# Patient Record
Sex: Male | Born: 1975 | Race: White | Hispanic: No | Marital: Married | Smoking: Never smoker
Health system: Southern US, Community
[De-identification: ages and names within clinical notes are randomized; demographics above are authoritative.]

---

## 1997-12-28 ENCOUNTER — Emergency Department (HOSPITAL_COMMUNITY): Admission: EM | Admit: 1997-12-28 | Discharge: 1997-12-28 | Payer: Self-pay | Admitting: Emergency Medicine

## 2003-05-02 ENCOUNTER — Emergency Department (HOSPITAL_COMMUNITY): Admission: EM | Admit: 2003-05-02 | Discharge: 2003-05-02 | Payer: Self-pay | Admitting: Emergency Medicine

## 2016-07-04 ENCOUNTER — Encounter (HOSPITAL_BASED_OUTPATIENT_CLINIC_OR_DEPARTMENT_OTHER): Payer: Self-pay | Admitting: Emergency Medicine

## 2016-07-04 ENCOUNTER — Emergency Department (HOSPITAL_BASED_OUTPATIENT_CLINIC_OR_DEPARTMENT_OTHER): Payer: BLUE CROSS/BLUE SHIELD

## 2016-07-04 ENCOUNTER — Emergency Department (HOSPITAL_BASED_OUTPATIENT_CLINIC_OR_DEPARTMENT_OTHER)
Admission: EM | Admit: 2016-07-04 | Discharge: 2016-07-04 | Disposition: A | Discharge status: Home / Self Care | Payer: BLUE CROSS/BLUE SHIELD | Attending: Emergency Medicine | Admitting: Emergency Medicine

## 2016-07-04 DIAGNOSIS — K4090 Unilateral inguinal hernia, without obstruction or gangrene, not specified as recurrent: Secondary | ICD-10-CM | POA: Insufficient documentation

## 2016-07-04 DIAGNOSIS — R1031 Right lower quadrant pain: Secondary | ICD-10-CM | POA: Diagnosis present

## 2016-07-04 LAB — URINALYSIS, ROUTINE W REFLEX MICROSCOPIC
BILIRUBIN URINE: NEGATIVE
Glucose, UA: NEGATIVE mg/dL
HGB URINE DIPSTICK: NEGATIVE
KETONES UR: NEGATIVE mg/dL
Leukocytes, UA: NEGATIVE
Nitrite: NEGATIVE
Protein, ur: NEGATIVE mg/dL
SPECIFIC GRAVITY, URINE: 1.041 — AB (ref 1.005–1.030)
pH: 6 (ref 5.0–8.0)

## 2016-07-04 LAB — CBC WITH DIFFERENTIAL/PLATELET
Basophils Absolute: 0 10*3/uL (ref 0.0–0.1)
Basophils Relative: 0 %
Eosinophils Absolute: 0.5 10*3/uL (ref 0.0–0.7)
Eosinophils Relative: 4 %
HEMATOCRIT: 41.7 % (ref 39.0–52.0)
HEMOGLOBIN: 14.1 g/dL (ref 13.0–17.0)
LYMPHS ABS: 4.4 10*3/uL — AB (ref 0.7–4.0)
LYMPHS PCT: 35 %
MCH: 31 pg (ref 26.0–34.0)
MCHC: 33.8 g/dL (ref 30.0–36.0)
MCV: 91.6 fL (ref 78.0–100.0)
MONOS PCT: 7 %
Monocytes Absolute: 0.9 10*3/uL (ref 0.1–1.0)
NEUTROS ABS: 6.8 10*3/uL (ref 1.7–7.7)
NEUTROS PCT: 54 %
Platelets: 391 10*3/uL (ref 150–400)
RBC: 4.55 MIL/uL (ref 4.22–5.81)
RDW: 13 % (ref 11.5–15.5)
WBC: 12.5 10*3/uL — AB (ref 4.0–10.5)

## 2016-07-04 LAB — COMPREHENSIVE METABOLIC PANEL
ALK PHOS: 63 U/L (ref 38–126)
ALT: 19 U/L (ref 17–63)
ANION GAP: 8 (ref 5–15)
AST: 22 U/L (ref 15–41)
Albumin: 4.2 g/dL (ref 3.5–5.0)
BILIRUBIN TOTAL: 0.4 mg/dL (ref 0.3–1.2)
BUN: 16 mg/dL (ref 6–20)
CALCIUM: 8.9 mg/dL (ref 8.9–10.3)
CO2: 26 mmol/L (ref 22–32)
CREATININE: 0.97 mg/dL (ref 0.61–1.24)
Chloride: 105 mmol/L (ref 101–111)
Glucose, Bld: 198 mg/dL — ABNORMAL HIGH (ref 65–99)
Potassium: 3.8 mmol/L (ref 3.5–5.1)
SODIUM: 139 mmol/L (ref 135–145)
TOTAL PROTEIN: 7.2 g/dL (ref 6.5–8.1)

## 2016-07-04 LAB — LIPASE, BLOOD: Lipase: 45 U/L (ref 11–51)

## 2016-07-04 MED ORDER — ONDANSETRON 4 MG PO TBDP: 4.0000 mg | ORAL_TABLET | Freq: Four times a day (QID) | ORAL | 0 refills | Status: AC | PRN

## 2016-07-04 MED ORDER — HYDROMORPHONE HCL 1 MG/ML IJ SOLN
1.0000 mg | Freq: Once | INTRAMUSCULAR | Status: AC
  Administered 2016-07-04: 1 mg via INTRAVENOUS
  Filled 2016-07-04: qty 1

## 2016-07-04 MED ORDER — LORAZEPAM 2 MG/ML IJ SOLN
0.5000 mg | Freq: Once | INTRAMUSCULAR | Status: AC
  Administered 2016-07-04: 0.5 mg via INTRAVENOUS

## 2016-07-04 MED ORDER — SODIUM CHLORIDE 0.9 % IV BOLUS (SEPSIS)
1000.0000 mL | Freq: Once | INTRAVENOUS | Status: AC
  Administered 2016-07-04: 1000 mL via INTRAVENOUS

## 2016-07-04 MED ORDER — LORAZEPAM 2 MG/ML IJ SOLN
0.5000 mg | Freq: Once | INTRAMUSCULAR | Status: AC
  Administered 2016-07-04: 0.5 mg via INTRAVENOUS
  Filled 2016-07-04: qty 1

## 2016-07-04 MED ORDER — LORAZEPAM 2 MG/ML IJ SOLN
INTRAMUSCULAR | Status: AC
  Filled 2016-07-04: qty 1

## 2016-07-04 MED ORDER — HYDROMORPHONE HCL 1 MG/ML IJ SOLN
INTRAMUSCULAR | Status: AC
  Filled 2016-07-04: qty 1

## 2016-07-04 MED ORDER — HYDROCODONE-ACETAMINOPHEN 5-325 MG PO TABS: 2.0000 | ORAL_TABLET | Freq: Four times a day (QID) | ORAL | 0 refills | Status: AC | PRN

## 2016-07-04 MED ORDER — HYDROMORPHONE HCL 1 MG/ML IJ SOLN
0.5000 mg | Freq: Once | INTRAMUSCULAR | Status: AC
  Administered 2016-07-04: 0.5 mg via INTRAVENOUS

## 2016-07-04 MED ORDER — IOPAMIDOL (ISOVUE-300) INJECTION 61%
100.0000 mL | Freq: Once | INTRAVENOUS | Status: AC | PRN
  Administered 2016-07-04: 100 mL via INTRAVENOUS

## 2016-07-04 MED ORDER — ONDANSETRON HCL 4 MG/2ML IJ SOLN
4.0000 mg | Freq: Once | INTRAMUSCULAR | Status: AC
  Administered 2016-07-04: 4 mg via INTRAVENOUS
  Filled 2016-07-04: qty 2

## 2016-07-04 MED FILL — ONDANSETRON ODT 4 MG TABLET: 4 | 6 days supply | Qty: 20 | Fill #0

## 2016-07-04 MED FILL — HYDROCODON-APAP 5-325: 5-325 | 2 days supply | Qty: 10 | Fill #0

## 2016-07-04 NOTE — ED Triage Notes (Signed)
Pt reports sudden onset of right groin pain radiating to abd and right flank. Pt reports urge to urinate but has can't.

## 2016-07-04 NOTE — ED Provider Notes (Signed)
TIME SEEN: 4:59 AM  CHIEF COMPLAINT: Right groin pain  HPI: Pt is a 41 y.o. male with no significant past medical history who presents emergency department with right groin pain and swelling. States pain woke him from sleep around 2:30 AM and is sharp and severe. Radiates into his back. Wife does report that he has had this area of swelling for several weeks but refused to go to a doctor for it. He denies fevers, chills but has had nausea. No vomiting or diarrhea. No dysuria or hematuria. No testicular pain or swelling. No penile discharge. No history of abdominal surgeries. Never had a kidney stone before.  ROS: See HPI Constitutional: no fever  Eyes: no drainage  ENT: no runny nose   Cardiovascular:  no chest pain  Resp: no SOB  GI: no vomiting GU: no dysuria Integumentary: no rash  Allergy: no hives  Musculoskeletal: no leg swelling  Neurological: no slurred speech ROS otherwise negative  PAST MEDICAL HISTORY/PAST SURGICAL HISTORY:  History reviewed. No pertinent past medical history.  MEDICATIONS:  Prior to Admission medications   Not on File    ALLERGIES:  Allergies  Allergen Reactions  . Penicillins Other (See Comments)    unknown    SOCIAL HISTORY:  Social History  Substance Use Topics  . Smoking status: Never Smoker  . Smokeless tobacco: Never Used  . Alcohol use No    FAMILY HISTORY: No family history on file.  EXAM: Pulse 76   Temp 97.9 F (36.6 C) (Oral)   Resp (!) 22   Ht 6' 0.5" (1.842 m)   Wt 77.1 kg (170 lb)   SpO2 100%   BMI 22.74 kg/m  CONSTITUTIONAL: Alert and oriented and responds appropriately to questions. Appears very uncomfortable, anxious. Afebrile and nontoxic. Well-hydrated on exam. HEAD: Normocephalic EYES: Conjunctivae clear, pupils appear equal, EOMI ENT: normal nose; moist mucous membranes NECK: Supple, no meningismus, no nuchal rigidity, no LAD  CARD: RRR; S1 and S2 appreciated; no murmurs, no clicks, no rubs, no  gallops RESP: Normal chest excursion without splinting or tachypnea; breath sounds clear and equal bilaterally; no wheezes, no rhonchi, no rales, no hypoxia or respiratory distress, speaking full sentences ABD/GI: Normal bowel sounds; non-distended; soft, patient has a right inguinal hernia that is tender to palpation with no overlying skin changes, no rebound, no guarding, no peritoneal signs, no hepatosplenomegaly GU:  Normal external genitalia, circumcised male, normal penile shaft, no blood or discharge at the urethral meatus, no testicular masses or tenderness on exam, no scrotal masses or swelling, patient has nonreducible right inguinal hernia on exam, 2+ femoral pulses bilaterally; no perineal erythema, warmth, subcutaneous air or crepitus; no high riding testicle, normal bilateral cremasteric reflex.  Chaperone present for exam. BACK:  The back appears normal and is non-tender to palpation, there is no CVA tenderness EXT: Normal ROM in all joints; non-tender to palpation; no edema; normal capillary refill; no cyanosis, no calf tenderness or swelling    SKIN: Normal color for age and race; warm; no rash NEURO: Moves all extremities equally PSYCH: The patient's mood and manner are appropriate. Grooming and personal hygiene are appropriate.  MEDICAL DECISION MAKING: Patient here with right inguinal hernia. There is no overlying skin changes. Abdomen is soft and not distended. He has normal bowel sounds. No vomiting. No signs of bowel obstruction at this time. Patient is very tender over this area and I am unable to reduce it despite pleasing patient in Trendelenburg. We'll give pain medication, anxiety  medication and reassess.  ED PROGRESS: Patient much more comfortable after Dilaudid and Ativan but still unable to reduce his hernia. Will obtain labs, CT of the abdomen and pelvis as his pain seems out of proportion to the small hernia on exam.    Labs show mild leukocytosis without left shift.  I have attempted to reduce his hernia for a third time after second round of Dilaudid and Ativan and I am able to reduce it partially within patient begins to guard increasing his intra-abdominal pressure which pushes the hernia back out. We have attempted to explain to him several times the need to relax. Patient is anxious, difficult to redirect.   CT scan shows right inguinal hernia with dilated loop of small bowel. Urine shows no sign of infection or ketones. On my fourth attempt to reduce his hernia was able to successfully reduce the hernia and he is now completely pain-free. He is sedated after Dilaudid and Ativan and will need to be monitored until more awake. Patient's mother and wife at bedside are comfortable with this plan. Plan will be to discharge patient home with outpatient general surgery follow-up, supportive care instructions and return precautions. We'll discharge with prescriptions of Vicodin and Zofran to take as needed. CT scan did show concern for possible early bowel obstruction however given this area was able to be reproduced do not think he has a bowel obstruction. We will make sure he can need and drink prior to discharge.  At this time, I do not feel there is any life-threatening condition present. I have reviewed and discussed all results (EKG, imaging, lab, urine as appropriate) and exam findings with patient/family. I have reviewed nursing notes and appropriate previous records.  I feel the patient is safe to be discharged home without further emergent workup and can continue workup as an outpatient as needed. Discussed usual and customary return precautions. Patient/family verbalize understanding and are comfortable with this plan.  Outpatient follow-up has been provided if needed. All questions have been answered.      Ward, Layla MawKristen N, DO 07/04/16 [phone]

## 2016-07-04 NOTE — Discharge Instructions (Signed)
I recommend close follow-up with general surgery.  If this area continues to bother you, they may recommend surgical treatment. If you ever notice that this area is stuck out again and painful I recommend that you lie flat, relax, take slow deep breaths and try to gently push the hernia back in. If you're unable to do so or the pain increases, you have fever, redness or warmth or other skin changes over this area, vomiting and cannot stop, please return to the emergency department.  I recommend for the next 2 weeks you do not do any strenuous activities or lift over 15 pounds.

## 2016-07-04 NOTE — ED Notes (Signed)
Pt placed on oxygen 3L due to oxygen sat decreasing to 85%. Sat improved to 94 on oxygen.

## 2016-07-04 NOTE — ED Triage Notes (Signed)
Pt reports swelling to groin area.

## 2016-07-04 NOTE — ED Notes (Signed)
Patient transported to CT 

## 2016-07-04 NOTE — ED Notes (Signed)
Dr. Elesa MassedWard attempted to reduce hernia. Pt unable to tolerate. Will attempt to give additional medication and try to reduce again.

## 2016-07-04 NOTE — ED Provider Notes (Signed)
9:30 AM patient is alert and story asymptomatic not lightheaded on standing and feels ready for discharge   Jason Levine, Treven Holtman, MD 07/04/16 [phone]

## 2016-07-04 NOTE — ED Notes (Signed)
ED Provider at bedside. 

## 2016-07-04 NOTE — ED Notes (Signed)
Pt was able to ambulate with little assistance.  Pt noted to have a pulse rated of 126 when placing him back on the monitor.   Rn notified.

## 2016-07-04 NOTE — ED Notes (Signed)
Pt is very anxious. MD aware.

## 2016-07-04 NOTE — ED Notes (Signed)
Dr. Elesa MassedWard attempted to reduce hernia, pt was unable to tolerate again.

## 2018-07-05 IMAGING — CT CT ABD-PELV W/ CM
2 of 6 series · 15 of 46 positions shown, 17 images · IV contrast (APPLIED)
Comparison: Remote CT 05/02/2003

CLINICAL DATA: Right flank pain, right inguinal hernia.

EXAM:
CT ABDOMEN AND PELVIS WITH CONTRAST
TECHNIQUE: Multidetector CT imaging of the abdomen and pelvis was performed
using the standard protocol following bolus administration of
intravenous contrast.
CONTRAST:  100mL 1TCQMC-S33 IOPAMIDOL (1TCQMC-S33) INJECTION 61%

[Series 2: axial st · axial · 0.82mm/px · z∈[-511,-31]mm · 12 of 108 slices shown, 14 images]
[im 6/108  soft-tissue]
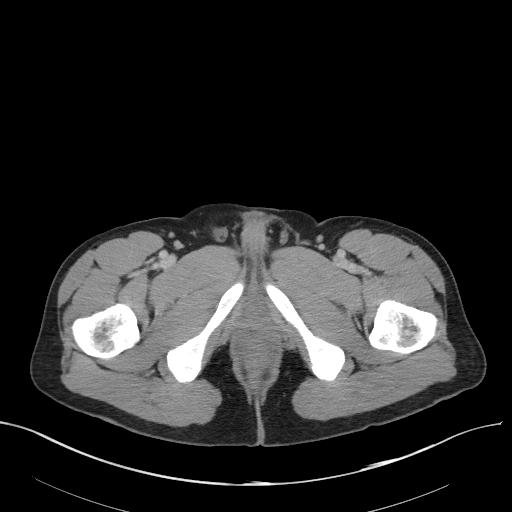
[im 6/108  bone]
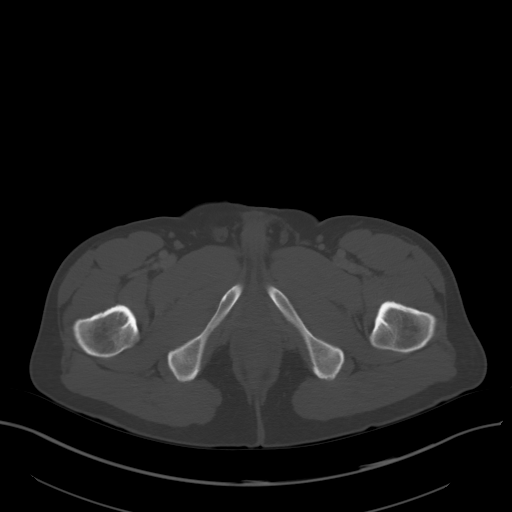
[im 18/108  soft-tissue]
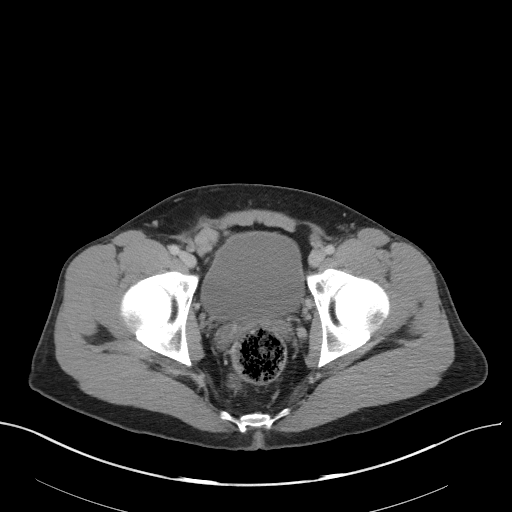
[im 24/108  soft-tissue]
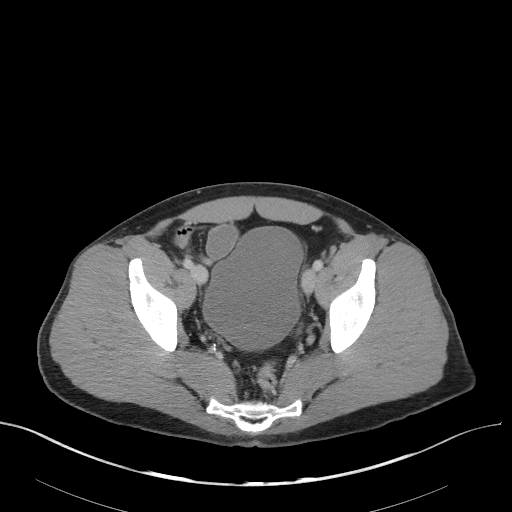
[im 30/108  soft-tissue]
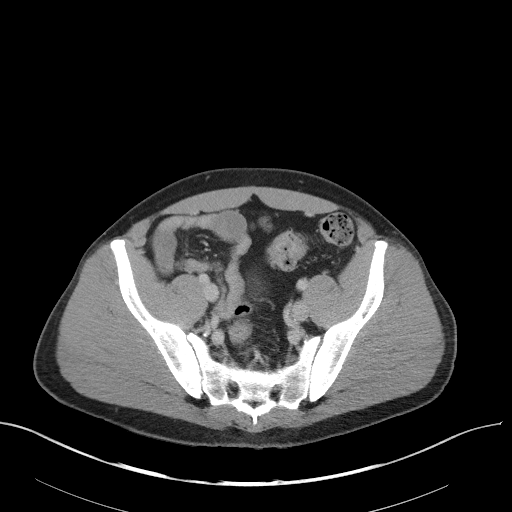
[im 42/108  soft-tissue]
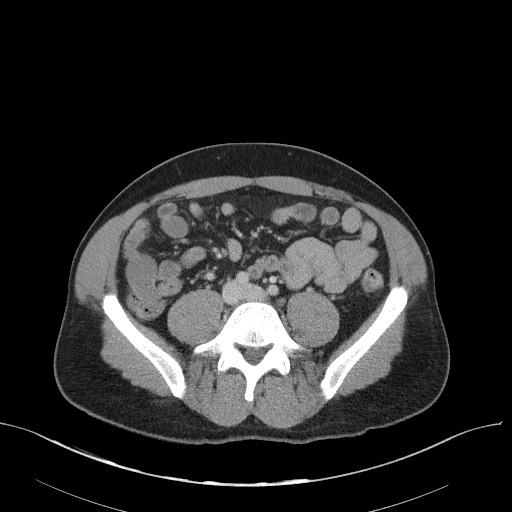
[im 48/108  soft-tissue]
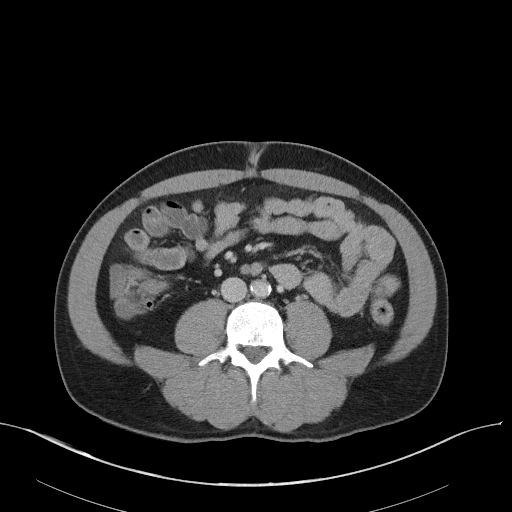
[im 60/108  soft-tissue]
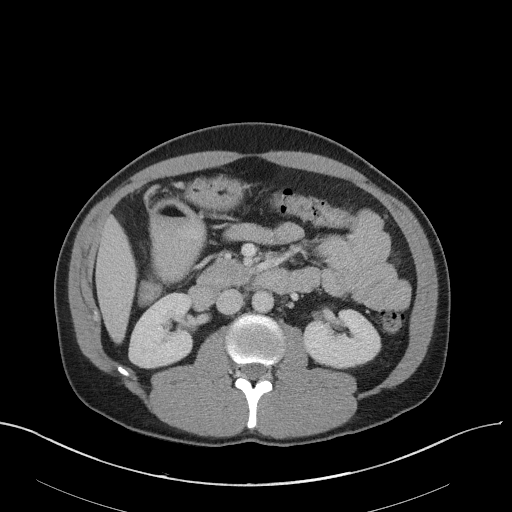
[im 66/108  soft-tissue]
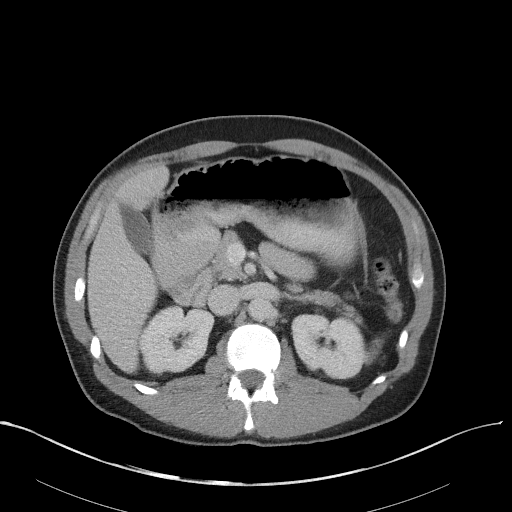
[im 78/108  soft-tissue]
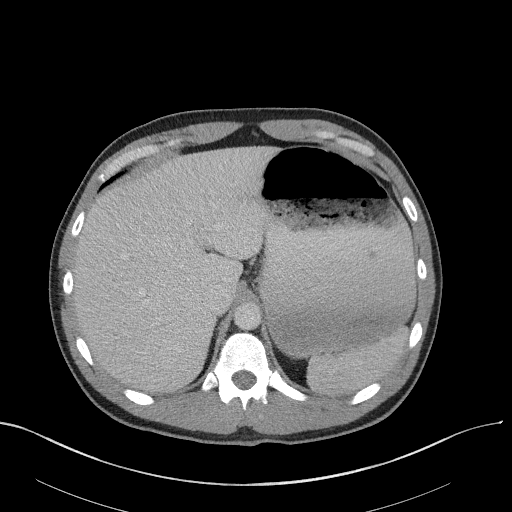
[im 78/108  bone]
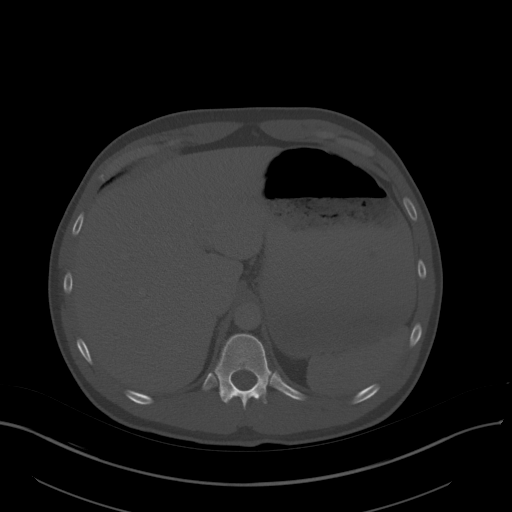
[im 84/108  soft-tissue]
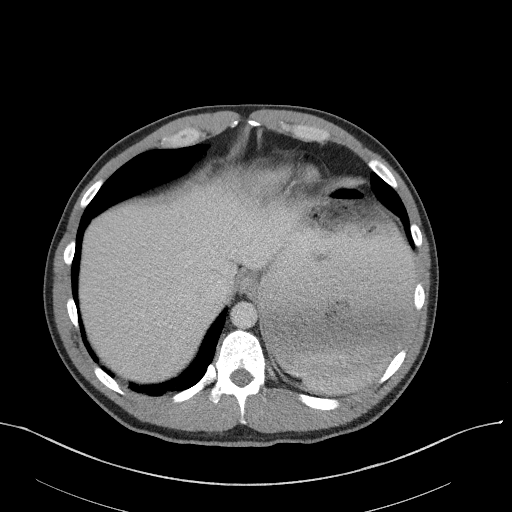
[im 90/108  soft-tissue]
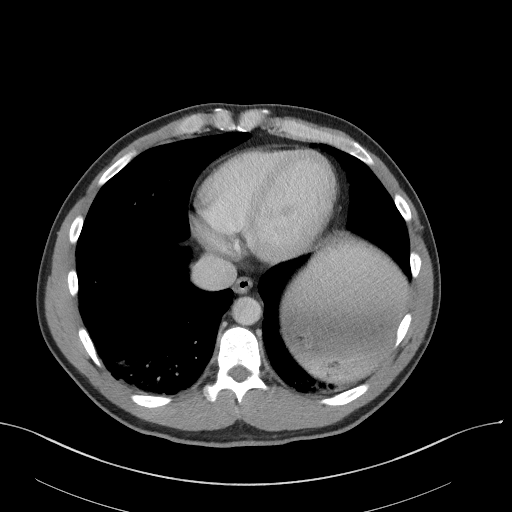
[im 102/108  soft-tissue]
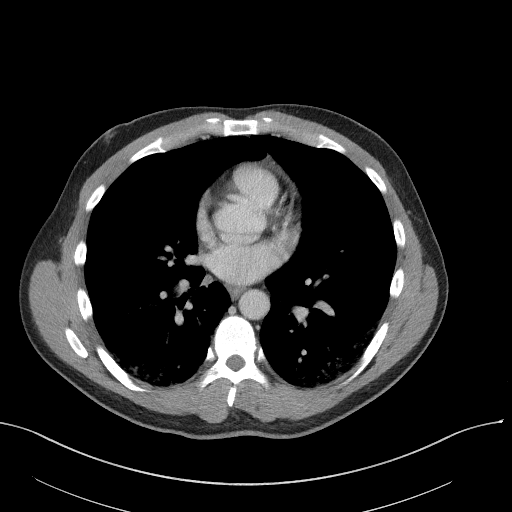

[Series 5: coronal st · coronal · 0.72mm/px · 3 of 87 slices shown]
[im 29/87  soft-tissue]
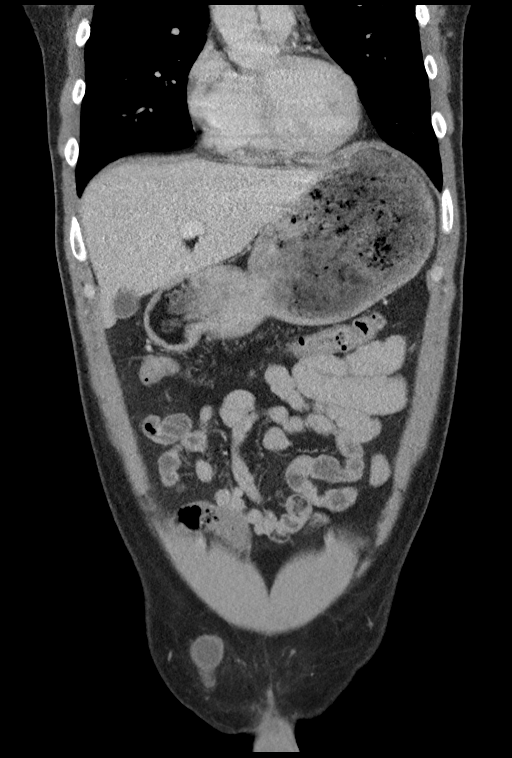
[im 39/87  soft-tissue]
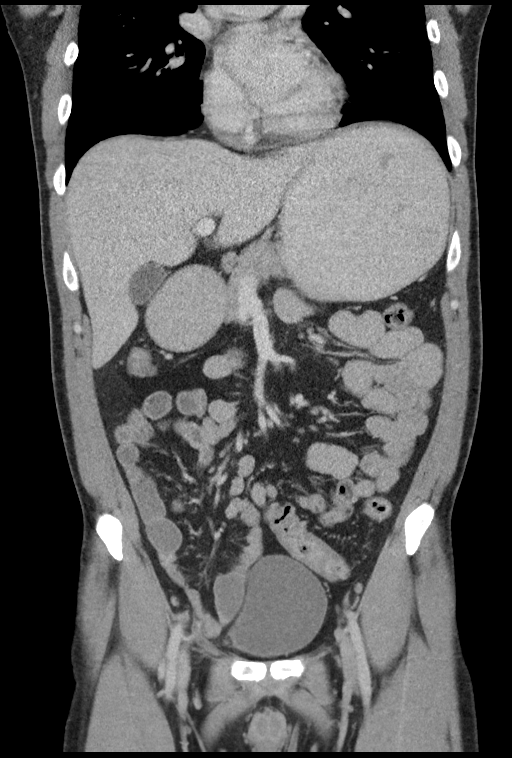
[im 48/87  soft-tissue]
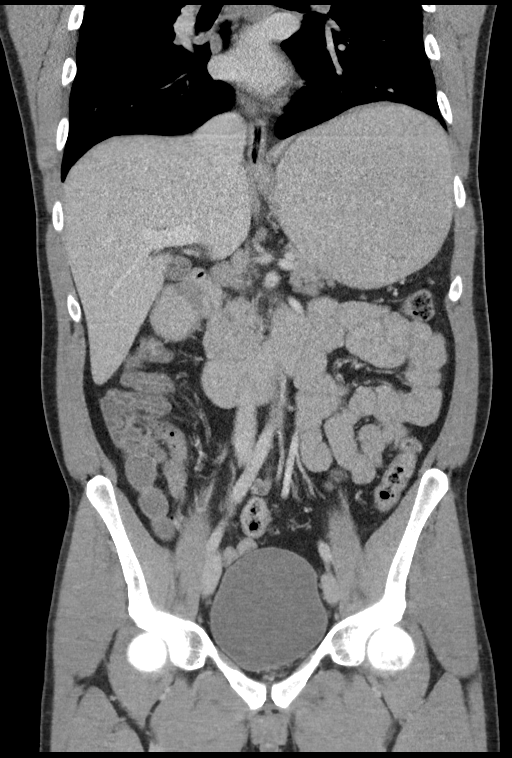

[15 of 46 positions shown; findings below may reference images not displayed]

FINDINGS: Lower chest: Depending ground-glass opacities the lung bases likely
atelectasis. Small pulmonary cyst in the lingula. Heart is normal in
size.

Hepatobiliary: See scattered subcentimeter hypodensities are too
small to accurately characterize, likely small cysts or hemangiomas.
Gallbladder physiologically distended, no calcified stone. No
biliary dilatation.

Pancreas: No ductal dilatation or inflammation.

Spleen: Normal in size without focal abnormality.

Adrenals/Urinary Tract: Normal adrenal glands. No hydronephrosis or
perinephric edema. Homogeneous renal enhancement and symmetric
excretion on delayed phase imaging. Urinary bladder is
physiologically distended without wall thickening.

Stomach/Bowel: Right inguinal hernia contains fluid-filled loops
above small bowel, no evidence of bowel inflammation or free fluid
in the hernia sac. Small bowel immediately proximal to the hernia
sac is dilated and fluid-filled. The stomach is distended with
ingested contrast. Normal appendix. Equivocal thickening of the
ascending colon versus nondistention. Small to moderate stool
burden. Normal appendix.

Vascular/Lymphatic: Calcified and noncalcified atheromatous plaque
in the abdominal aorta, no aneurysm. No adenopathy.

Reproductive: Prostate is unremarkable.

Other: No free air, free fluid, or intra-abdominal fluid collection.

Musculoskeletal: There are no acute or suspicious osseous
abnormalities.
IMPRESSION: 1. Right inguinal hernia containing dilated loop of small bowel.
This is likely causing early bowel obstruction with fluid-filled
prominent small bowel just proximal to the hernia sac an delayed
gastric emptying.
2. Aortic atherosclerosis, mild, however age advanced.
